# Patient Record
Sex: Male | Born: 1945 | Race: White | Hispanic: No | Marital: Married | Smoking: Never smoker
Health system: Southern US, Community
[De-identification: ages and names within clinical notes are randomized; demographics above are authoritative.]

## PROBLEM LIST (undated history)

## (undated) DIAGNOSIS — N289 Disorder of kidney and ureter, unspecified: Secondary | ICD-10-CM

## (undated) HISTORY — PX: APPENDECTOMY: SHX54

---

## 2001-08-15 ENCOUNTER — Encounter: Admission: RE | Admit: 2001-08-15 | Discharge: 2001-08-15 | Payer: Self-pay | Admitting: Family Medicine

## 2001-08-15 ENCOUNTER — Encounter: Payer: Self-pay | Admitting: Family Medicine

## 2009-06-07 ENCOUNTER — Encounter: Admission: RE | Admit: 2009-06-07 | Discharge: 2009-06-07 | Payer: Self-pay | Admitting: Otolaryngology

## 2009-07-08 ENCOUNTER — Encounter: Payer: Self-pay | Admitting: Otolaryngology

## 2009-08-05 ENCOUNTER — Ambulatory Visit (HOSPITAL_COMMUNITY): Admission: RE | Admit: 2009-08-05 | Discharge: 2009-08-05 | Payer: Self-pay | Admitting: Interventional Radiology

## 2009-08-16 ENCOUNTER — Inpatient Hospital Stay (HOSPITAL_COMMUNITY): Admission: RE | Admit: 2009-08-16 | Discharge: 2009-08-18 | Payer: Self-pay | Admitting: Interventional Radiology

## 2009-08-30 ENCOUNTER — Encounter: Payer: Self-pay | Admitting: Interventional Radiology

## 2010-01-12 ENCOUNTER — Ambulatory Visit (HOSPITAL_COMMUNITY): Admission: RE | Admit: 2010-01-12 | Discharge: 2010-01-13 | Payer: Self-pay | Admitting: Interventional Radiology

## 2010-01-26 ENCOUNTER — Encounter: Payer: Self-pay | Admitting: Interventional Radiology

## 2010-12-17 ENCOUNTER — Encounter: Payer: Self-pay | Admitting: Interventional Radiology

## 2011-02-15 LAB — CBC
HCT: 43.1 % (ref 39.0–52.0)
Hemoglobin: 15.1 g/dL (ref 13.0–17.0)
MCHC: 35 g/dL (ref 30.0–36.0)
MCHC: 35.2 g/dL (ref 30.0–36.0)
MCV: 88.9 fL (ref 78.0–100.0)
MCV: 90 fL (ref 78.0–100.0)
Platelets: 159 10*3/uL (ref 150–400)
Platelets: 205 10*3/uL (ref 150–400)
RBC: 4.01 MIL/uL — ABNORMAL LOW (ref 4.22–5.81)
RBC: 4.84 MIL/uL (ref 4.22–5.81)
RDW: 12.9 % (ref 11.5–15.5)
RDW: 13.1 % (ref 11.5–15.5)

## 2011-02-15 LAB — DIFFERENTIAL
Lymphocytes Relative: 23 % (ref 12–46)
Monocytes Relative: 8 % (ref 3–12)
Neutro Abs: 5 10*3/uL (ref 1.7–7.7)
Neutrophils Relative %: 66 % (ref 43–77)

## 2011-02-15 LAB — BASIC METABOLIC PANEL
BUN: 14 mg/dL (ref 6–23)
CO2: 29 mEq/L (ref 19–32)
Calcium: 8.4 mg/dL (ref 8.4–10.5)
Calcium: 9.3 mg/dL (ref 8.4–10.5)
Chloride: 104 mEq/L (ref 96–112)
GFR calc Af Amer: >60 mL/min (ref >60)
GFR calc non Af Amer: >60 mL/min (ref >60)
Glucose, Bld: 109 mg/dL — ABNORMAL HIGH (ref 70–99)
Sodium: 138 mEq/L (ref 135–145)

## 2011-03-03 LAB — PROTIME-INR
INR: 0.9 (ref 0.00–1.49)
Prothrombin Time: 12.9 seconds (ref 11.6–15.2)

## 2011-03-03 LAB — BASIC METABOLIC PANEL
BUN: 19 mg/dL (ref 6–23)
BUN: 9 mg/dL (ref 6–23)
CO2: 27 mEq/L (ref 19–32)
CO2: 28 mEq/L (ref 19–32)
Calcium: 8.2 mg/dL — ABNORMAL LOW (ref 8.4–10.5)
Calcium: 9.1 mg/dL (ref 8.4–10.5)
Calcium: 9.3 mg/dL (ref 8.4–10.5)
Chloride: 104 mEq/L (ref 96–112)
Chloride: 113 mEq/L — ABNORMAL HIGH (ref 96–112)
Creatinine, Ser: 0.87 mg/dL (ref 0.4–1.5)
Creatinine, Ser: 0.89 mg/dL (ref 0.4–1.5)
GFR calc Af Amer: >60 mL/min (ref >60)
GFR calc Af Amer: >60 mL/min (ref >60)
GFR calc Af Amer: >60 mL/min (ref >60)
GFR calc non Af Amer: >60 mL/min (ref >60)
GFR calc non Af Amer: >60 mL/min (ref >60)
Glucose, Bld: 100 mg/dL — ABNORMAL HIGH (ref 70–99)
Glucose, Bld: 115 mg/dL — ABNORMAL HIGH (ref 70–99)
Potassium: 4 mEq/L (ref 3.5–5.1)
Sodium: 142 mEq/L (ref 135–145)

## 2011-03-03 LAB — CBC
Hemoglobin: 14.4 g/dL (ref 13.0–17.0)
Hemoglobin: 14.4 g/dL (ref 13.0–17.0)
MCHC: 34.9 g/dL (ref 30.0–36.0)
MCHC: 34.9 g/dL (ref 30.0–36.0)
MCV: 89.5 fL (ref 78.0–100.0)
MCV: 90.2 fL (ref 78.0–100.0)
Platelets: 162 10*3/uL (ref 150–400)
RBC: 4.54 MIL/uL (ref 4.22–5.81)
RBC: 4.56 MIL/uL (ref 4.22–5.81)
RDW: 13.2 % (ref 11.5–15.5)
RDW: 13.4 % (ref 11.5–15.5)
RDW: 12.7 % (ref 11.5–15.5)

## 2011-03-03 LAB — DIFFERENTIAL
Basophils Absolute: 0 10*3/uL (ref 0.0–0.1)
Basophils Relative: 1 % (ref 0–1)
Eosinophils Absolute: 0.3 10*3/uL (ref 0.0–0.7)
Eosinophils Relative: 4 % (ref 0–5)
Lymphocytes Relative: 30 % (ref 12–46)
Lymphocytes Relative: 26 % (ref 12–46)
Monocytes Absolute: 0.6 10*3/uL (ref 0.1–1.0)
Monocytes Absolute: 0.6 10*3/uL (ref 0.1–1.0)
Monocytes Relative: 9 % (ref 3–12)
Neutro Abs: 3.5 10*3/uL (ref 1.7–7.7)
Neutro Abs: 4.4 10*3/uL (ref 1.7–7.7)
Neutrophils Relative %: 55 % (ref 43–77)
Neutrophils Relative %: 61 % (ref 43–77)

## 2011-03-03 LAB — APTT: aPTT: 28 seconds (ref 24–37)

## 2011-04-11 NOTE — Consult Note (Signed)
NAME:  Gerald Sherman, Gerald Sherman                ACCOUNT NO.:  [id number]   MEDICAL RECORD NO.:  [id number]          PATIENT TYPE:  OUT   LOCATION:  [facility]                         FACILITY:  [facility]   PHYSICIAN:  Sanjeev K. Deveshwar, M.D.DATE OF BIRTH:  1946-03-03   DATE OF CONSULTATION:  07/08/2009  DATE OF DISCHARGE:  07/08/2009                                 CONSULTATION   CHIEF COMPLAINT:  Left-sided pulsatile tinnitus for approximately 2  years.   BRIEF HISTORY:  This is a very pleasant 65 year old male who was  referred to Dr. Corliss Skains through the courtesy of Dr. Suzanna Obey for  further evaluation of pulsatile tinnitus.  The patient has been having  this problem for approximately 2 years.  He has not had any significant  associated symptoms.  He denies headache.  He denies visual changes,  although the volume of the tinnitus appears to be increasing.  The  patient denies any recent trauma, although he does remember hitting the  top of his head on an overhead board on at least 2 occasions in his  workshop.  He subsequently removed that particular board.  The patient  had an MRA performed on June 07, 2009, as ordered by Dr. Jearld Fenton.  This  showed a left dural AV fistula.  The patient has been referred to Dr.  Corliss Skains for further evaluation and treatment options.  The patient  presents today accompanied by his wife.   PAST MEDICAL HISTORY:  The patient remembers having some slag from the  welding torch going to his left ear approximately 3 years ago, however,  this apparently was evaluated and no foreign object or significant  injury was found.  The patient had an episode of severe hypertension  approximately 3 years ago that was associated with a severe headache.  It is difficult to say which came first the severe headache or the  severe hypertension.  He has been normotensive since that time and has  not required antihypertensive medications.  He denies diabetes mellitus.  He denies  hyperlipidemia.  He denies coronary artery disease and has for  the most part been very healthy.   SURGICAL HISTORY:  The patient is status post appendectomy.  He denies  any previous problems with anesthesia, although he did become somewhat  agitated after his surgery when he found he was strapped to his gurney.   ALLERGIES:  No known drug allergies.   CURRENT MEDICATIONS:  Aspirin 81 mg daily and multivitamins.   SOCIAL HISTORY:  The patient is married.  The patient's wife has 4  children from her previous marriage.  The patient and his wife live in  [place].  The patient smoked from age 35 to age 32.  He has not used  tobacco since that time.  He has never been diagnosed as having any  significant lung disease.  He does not use alcohol.  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  He tends to work very long hours.   FAMILY HISTORY:  His mother died at age 40 from natural causes.  His  father died at age 80, the cause was unknown.   IMPRESSION AND PLAN:  The patient and his wife present today to discuss  a recently diagnosed left dural AV fistula as documented by an MRA  performed on June 07, 2009.  Unfortunately, they had very little  understanding of this diagnosis.  The defect was discussed and explained  in detail along with the risks of such an abnormality.  Dr. Corliss Skains  reviewed the patient's MRA and shared the images with the patient and  his wife.  He pointed out the abnormal vasculature and explained the  significance.  Dr. Corliss Skains was concerned that if it left untreated  this would eventually lead to a hemorrhagic stroke.   A cerebral angiogram has been recommended for further evaluation of the  vessels.  The angiogram was described in detail along with the risks and  benefits of the procedure.  We also discussed embolization of the AV  fistula.  This procedure was described in detail as well along with the  risks and benefits.  The cerebral angiogram  itself could be performed  under conscious sedation.  The embolization procedure would require  general anesthesia.  The patient and his wife would like to have both  procedures performed on the same day and are anxious to proceed in order  to stabilize this situation.   We have tentatively scheduled the patient for treatment on August 05, 2009.  The patient was told that he could continue with his normal  activities until last time.  Following the embolization, he would need  to be out of work for at least 2 weeks.  Dr. Corliss Skains recommended that  he continue his aspirin for now.  The [employer] representative has been  contacted regarding the procedure.  Greater than 1 hour was spent on  this evaluation.  All of the patient's and his wife's questions were  answered.  They leave today with a good understanding of the plan.  There were told to call if they had any questions whatsoever.      Delton See, P.A.    ______________________________  Grandville Silos. Corliss Skains, M.D.    DR/MEDQ  D:  07/08/2009  T:  07/09/2009  Job:  [id number]   cc:   Suzanna Obey, M.D.  Dr. Sherryll Burger

## 2015-02-23 DIAGNOSIS — J029 Acute pharyngitis, unspecified: Secondary | ICD-10-CM | POA: Diagnosis not present

## 2015-02-23 DIAGNOSIS — R05 Cough: Secondary | ICD-10-CM | POA: Diagnosis not present

## 2015-02-23 DIAGNOSIS — J209 Acute bronchitis, unspecified: Secondary | ICD-10-CM | POA: Diagnosis not present

## 2015-02-23 DIAGNOSIS — J019 Acute sinusitis, unspecified: Secondary | ICD-10-CM | POA: Diagnosis not present

## 2015-07-07 DIAGNOSIS — T1512XA Foreign body in conjunctival sac, left eye, initial encounter: Secondary | ICD-10-CM | POA: Diagnosis not present

## 2015-08-23 DIAGNOSIS — Z23 Encounter for immunization: Secondary | ICD-10-CM | POA: Diagnosis not present

## 2016-11-02 DIAGNOSIS — Z23 Encounter for immunization: Secondary | ICD-10-CM | POA: Diagnosis not present

## 2017-05-09 DIAGNOSIS — E785 Hyperlipidemia, unspecified: Secondary | ICD-10-CM | POA: Diagnosis not present

## 2017-05-09 DIAGNOSIS — R739 Hyperglycemia, unspecified: Secondary | ICD-10-CM | POA: Diagnosis not present

## 2017-05-09 DIAGNOSIS — Z0000 Encounter for general adult medical examination without abnormal findings: Secondary | ICD-10-CM | POA: Diagnosis not present

## 2017-05-09 DIAGNOSIS — Z1389 Encounter for screening for other disorder: Secondary | ICD-10-CM | POA: Diagnosis not present

## 2017-05-09 DIAGNOSIS — Z125 Encounter for screening for malignant neoplasm of prostate: Secondary | ICD-10-CM | POA: Diagnosis not present

## 2017-05-09 DIAGNOSIS — Z6828 Body mass index (BMI) 28.0-28.9, adult: Secondary | ICD-10-CM | POA: Diagnosis not present

## 2017-05-09 DIAGNOSIS — N529 Male erectile dysfunction, unspecified: Secondary | ICD-10-CM | POA: Diagnosis not present

## 2017-05-09 DIAGNOSIS — M549 Dorsalgia, unspecified: Secondary | ICD-10-CM | POA: Diagnosis not present

## 2017-05-09 DIAGNOSIS — R200 Anesthesia of skin: Secondary | ICD-10-CM | POA: Diagnosis not present

## 2017-05-09 DIAGNOSIS — E782 Mixed hyperlipidemia: Secondary | ICD-10-CM | POA: Diagnosis not present

## 2017-05-09 DIAGNOSIS — G8324 Monoplegia of upper limb affecting left nondominant side: Secondary | ICD-10-CM | POA: Diagnosis not present

## 2017-05-09 DIAGNOSIS — Z79899 Other long term (current) drug therapy: Secondary | ICD-10-CM | POA: Diagnosis not present

## 2018-05-13 DIAGNOSIS — N529 Male erectile dysfunction, unspecified: Secondary | ICD-10-CM | POA: Diagnosis not present

## 2018-05-13 DIAGNOSIS — Z0000 Encounter for general adult medical examination without abnormal findings: Secondary | ICD-10-CM | POA: Diagnosis not present

## 2018-05-13 DIAGNOSIS — Z1389 Encounter for screening for other disorder: Secondary | ICD-10-CM | POA: Diagnosis not present

## 2018-05-13 DIAGNOSIS — M545 Low back pain: Secondary | ICD-10-CM | POA: Diagnosis not present

## 2018-05-13 DIAGNOSIS — E663 Overweight: Secondary | ICD-10-CM | POA: Diagnosis not present

## 2018-05-13 DIAGNOSIS — Z125 Encounter for screening for malignant neoplasm of prostate: Secondary | ICD-10-CM | POA: Diagnosis not present

## 2018-05-13 DIAGNOSIS — Z6828 Body mass index (BMI) 28.0-28.9, adult: Secondary | ICD-10-CM | POA: Diagnosis not present

## 2018-05-13 DIAGNOSIS — E7800 Pure hypercholesterolemia, unspecified: Secondary | ICD-10-CM | POA: Diagnosis not present

## 2018-10-29 DIAGNOSIS — Z23 Encounter for immunization: Secondary | ICD-10-CM | POA: Diagnosis not present

## 2018-11-29 ENCOUNTER — Other Ambulatory Visit: Payer: Self-pay

## 2018-11-29 ENCOUNTER — Emergency Department (HOSPITAL_COMMUNITY)
Admission: EM | Admit: 2018-11-29 | Discharge: 2018-11-29 | Disposition: A | Discharge status: Home / Self Care | Payer: Medicare Other | Attending: Emergency Medicine | Admitting: Emergency Medicine

## 2018-11-29 ENCOUNTER — Encounter (HOSPITAL_COMMUNITY): Payer: Self-pay

## 2018-11-29 ENCOUNTER — Emergency Department (HOSPITAL_COMMUNITY): Payer: Medicare Other

## 2018-11-29 DIAGNOSIS — N200 Calculus of kidney: Secondary | ICD-10-CM | POA: Insufficient documentation

## 2018-11-29 DIAGNOSIS — N132 Hydronephrosis with renal and ureteral calculous obstruction: Secondary | ICD-10-CM | POA: Diagnosis not present

## 2018-11-29 DIAGNOSIS — R1084 Generalized abdominal pain: Secondary | ICD-10-CM | POA: Diagnosis present

## 2018-11-29 HISTORY — DX: Disorder of kidney and ureter, unspecified: N28.9

## 2018-11-29 LAB — URINALYSIS, ROUTINE W REFLEX MICROSCOPIC
BILIRUBIN URINE: NEGATIVE
Glucose, UA: NEGATIVE mg/dL
KETONES UR: NEGATIVE mg/dL
NITRITE: NEGATIVE
PH: 7 (ref 5.0–8.0)
Protein, ur: NEGATIVE mg/dL
RBC / HPF: >50 RBC/hpf — ABNORMAL HIGH (ref 0–5)
SPECIFIC GRAVITY, URINE: 1.016 (ref 1.005–1.030)

## 2018-11-29 LAB — BASIC METABOLIC PANEL
Anion gap: 9 (ref 5–15)
BUN: 24 mg/dL — ABNORMAL HIGH (ref 8–23)
CALCIUM: 9.3 mg/dL (ref 8.9–10.3)
CHLORIDE: 104 mmol/L (ref 98–111)
CO2: 25 mmol/L (ref 22–32)
CREATININE: 1.74 mg/dL — AB (ref 0.61–1.24)
GFR calc Af Amer: 45 mL/min — ABNORMAL LOW (ref >60)
GFR calc non Af Amer: 39 mL/min — ABNORMAL LOW (ref >60)
GLUCOSE: 108 mg/dL — AB (ref 70–99)
Potassium: 3.9 mmol/L (ref 3.5–5.1)
Sodium: 138 mmol/L (ref 135–145)

## 2018-11-29 LAB — CBC WITH DIFFERENTIAL/PLATELET
Abs Immature Granulocytes: 0.03 10*3/uL (ref 0.00–0.07)
BASOS ABS: 0 10*3/uL (ref 0.0–0.1)
Basophils Relative: 0 %
EOS ABS: 0.2 10*3/uL (ref 0.0–0.5)
EOS PCT: 2 %
HEMATOCRIT: 42.4 % (ref 39.0–52.0)
HEMOGLOBIN: 14.2 g/dL (ref 13.0–17.0)
IMMATURE GRANULOCYTES: 0 %
LYMPHS ABS: 1.1 10*3/uL (ref 0.7–4.0)
LYMPHS PCT: 11 %
MCH: 29.2 pg (ref 26.0–34.0)
MCHC: 33.5 g/dL (ref 30.0–36.0)
MCV: 87.2 fL (ref 80.0–100.0)
MONOS PCT: 13 %
Monocytes Absolute: 1.4 10*3/uL — ABNORMAL HIGH (ref 0.1–1.0)
NRBC: 0 % (ref 0.0–0.2)
Neutro Abs: 7.5 10*3/uL (ref 1.7–7.7)
Neutrophils Relative %: 74 %
Platelets: 183 10*3/uL (ref 150–400)
RBC: 4.86 MIL/uL (ref 4.22–5.81)
RDW: 13 % (ref 11.5–15.5)
WBC: 10.2 10*3/uL (ref 4.0–10.5)

## 2018-11-29 MED ORDER — HYDROMORPHONE HCL 1 MG/ML IJ SOLN
1.0000 mg | Freq: Once | INTRAMUSCULAR | Status: AC
  Administered 2018-11-29: 1 mg via INTRAVENOUS
  Filled 2018-11-29: qty 1

## 2018-11-29 MED ORDER — HYDROCODONE-ACETAMINOPHEN 5-325 MG PO TABS: 1.0000 | ORAL_TABLET | Freq: Four times a day (QID) | ORAL | 0 refills | Status: AC | PRN

## 2018-11-29 MED ORDER — ONDANSETRON HCL 4 MG/2ML IJ SOLN
4.0000 mg | Freq: Once | INTRAMUSCULAR | Status: AC
  Administered 2018-11-29: 4 mg via INTRAVENOUS
  Filled 2018-11-29: qty 2

## 2018-11-29 MED ORDER — SODIUM CHLORIDE 0.9 % IV BOLUS
500.0000 mL | Freq: Once | INTRAVENOUS | Status: AC
  Administered 2018-11-29: 500 mL via INTRAVENOUS

## 2018-11-29 NOTE — ED Provider Notes (Signed)
[facility] EMERGENCY DEPARTMENT Provider Note   CSN: [id number] Arrival date & time: 11/29/18  [id number]     History   Chief Complaint Chief Complaint  Patient presents with  . Flank Pain    HPI Gerald Sherman is a 73 y.o. male.  HPI Patient presents with left flank pain.  Has had for most of this week.  History of kidney stones the last 1 passed without having to go to the ER.  Last CAT scan was probably around 10 years ago.  Pain is severe in the left flank.  Has a history of back pain but states this feels different.  Has had some difficulty urinating.  Has had some chills at times.  Has had some nausea.  No frank fevers however.  Pain is sharp.  Patient took 2 Norco's that he had at home to help with the pain.  States that he normally only has to take half a pill. Past Medical History:  Diagnosis Date  . Renal disorder    kidney stones    There are no active problems to display for this patient.   Past Surgical History:  Procedure Laterality Date  . APPENDECTOMY          Home Medications    Prior to Admission medications   Medication Sig Start Date End Date Taking? Authorizing Provider  HYDROcodone-acetaminophen (NORCO/VICODIN) 5-325 MG tablet Take 1-2 tablets by mouth every 6 (six) hours as needed. 11/29/18   Benjiman CorePickering, Iver Miklas, MD    Family History No family history on file.  Social History Social History   Tobacco Use  . Smoking status: Never Smoker  . Smokeless tobacco: Never Used  Substance Use Topics  . Alcohol use: Never    Frequency: Never  . Drug use: Never     Allergies   Patient has no known allergies.   Review of Systems Review of Systems  Constitutional: Positive for chills. Negative for appetite change.  HENT: Negative for congestion.   Respiratory: Negative for shortness of breath.   Cardiovascular: Negative for chest pain.  Gastrointestinal: Positive for nausea. Negative for abdominal pain.  Genitourinary: Positive for difficulty  urinating and flank pain.  Musculoskeletal: Positive for back pain.  Skin: Negative for rash.  Neurological: Negative for weakness.  Psychiatric/Behavioral: Negative for confusion.     Physical Exam Updated Vital Signs BP 127/75 (BP Location: Right Arm)   Pulse 78   Temp 98.1 F (36.7 C) (Oral)   Resp 18   Ht 6\' 2"  (1.88 m)   Wt 90.7 kg   SpO2 97%   BMI 25.68 kg/m   Physical Exam Constitutional:      Comments: Patient appears uncomfortable  HENT:     Head: Atraumatic.  Eyes:     Conjunctiva/sclera: Conjunctivae normal.  Neck:     Musculoskeletal: Neck supple.  Cardiovascular:     Rate and Rhythm: Normal rate and regular rhythm.  Pulmonary:     Breath sounds: No wheezing or rhonchi.  Chest:     Chest wall: No tenderness.  Abdominal:     General: There is distension.  Genitourinary:    Comments: CVA tenderness on left. Musculoskeletal:        General: No deformity.  Skin:    General: Skin is warm.     Capillary Refill: Capillary refill takes less than 2 seconds.  Neurological:     General: No focal deficit present.     Mental Status: He is alert.  Psychiatric:  Mood and Affect: Mood normal.      ED Treatments / Results  Labs (all labs ordered are listed, but only abnormal results are displayed) Labs Reviewed  BASIC METABOLIC PANEL - Abnormal; Notable for the following components:      Result Value   Glucose, Bld 108 (*)    BUN 24 (*)    Creatinine, Ser 1.74 (*)    GFR calc non Af Amer 39 (*)    GFR calc Af Amer 45 (*)    All other components within normal limits  CBC WITH DIFFERENTIAL/PLATELET - Abnormal; Notable for the following components:   Monocytes Absolute 1.4 (*)    All other components within normal limits  URINALYSIS, ROUTINE W REFLEX MICROSCOPIC - Abnormal; Notable for the following components:   APPearance HAZY (*)    Hgb urine dipstick LARGE (*)    Leukocytes, UA TRACE (*)    RBC / HPF >50 (*)    Bacteria, UA RARE (*)    All  other components within normal limits    EKG None  Radiology Ct Renal Stone Study  Result Date: 11/29/2018 CLINICAL DATA:  Left flank pain since Wednesday. More severe this morning. EXAM: CT ABDOMEN AND PELVIS WITHOUT CONTRAST TECHNIQUE: Multidetector CT imaging of the abdomen and pelvis was performed following the standard protocol without IV contrast. COMPARISON:  None available. FINDINGS: Lower chest: Bibasilar scarring or atelectasis but no infiltrates or effusions. No worrisome pulmonary lesions. The heart is normal in size. No pericardial effusion. The distal esophagus is grossly normal. Hepatobiliary: Small calcified granuloma but no worrisome hepatic lesions or intrahepatic biliary dilatation. The gallbladder appears normal. No common bile duct dilatation. Pancreas: No mass, inflammation or ductal dilatation. Spleen: Normal size.  No focal lesions. Adrenals/Urinary Tract: The adrenal glands are normal. Right kidney: 3.5 mm midpole right renal calculus but no obstructing ureteral calculi or hydronephrosis. No worrisome renal lesions without contrast. Left kidney: Moderate to marked left-sided hydroureteronephrosis down to an obstructing 4 mm calculus at the left UVJ. No left-sided renal calculi. Simple midpole cyst is noted. No worrisome left renal lesions. The bladder is unremarkable. No worrisome bladder lesions. The right side of the bladder is pointing towards a right inguinal hernia. Stomach/Bowel: The stomach, duodenum, small bowel and colon are grossly normal without oral contrast. No inflammatory changes, mass lesions or obstructive findings. The terminal ileum and appendix are normal. Advanced descending and sigmoid colon diverticulosis but no findings for acute diverticulitis. The terminal ileum is normal. The appendix is surgically absent. Vascular/Lymphatic: The aorta is normal in caliber. Scattered atheroscerlotic calcifications. No mesenteric of retroperitoneal mass or adenopathy. Small  scattered lymph nodes are noted. Large calcification noted near the posterior wall of the stomach. Reproductive: The prostate gland and seminal vesicles are unremarkable. Other: No pelvic mass or adenopathy. No free pelvic fluid collections. No inguinal mass or adenopathy. There are bilateral inguinal hernias containing fat, right larger than left. No abdominal wall hernia or subcutaneous lesions. Musculoskeletal: No significant bony findings. Moderate degenerative changes involving the spine. IMPRESSION: 1. 4 mm left UVJ calculus causing moderate to marked obstructive findings with hydroureteronephrosis. 2. Right renal calculus but no obstructing right ureteral calculus. 3. No worrisome renal or bladder lesions without contrast. 4. No other acute abdominal/pelvic findings and no mass lesions or lymphadenopathy. 5. Right inguinal hernia containing fat. The lower right aspect of the bladder is starting to enter the hernia. Electronically Signed   By: Rudie Meyer M.D.   On: 11/29/2018 09:10  Procedures Procedures (including critical care time)  Medications Ordered in ED Medications  HYDROmorphone (DILAUDID) injection 1 mg (1 mg Intravenous Given 11/29/18 0805)  ondansetron (ZOFRAN) injection 4 mg (4 mg Intravenous Given 11/29/18 0805)  sodium chloride 0.9 % bolus 500 mL (0 mLs Intravenous Stopped 11/29/18 1102)     Initial Impression / Assessment and Plan / ED Course  I have reviewed the triage vital signs and the nursing notes.  Pertinent labs & imaging results that were available during my care of the patient were reviewed by me and considered in my medical decision making (see chart for details).     Patient with flank pain.  Has previous ureteral stone and shows stone on the left side.  4 mm.  Pain improved.  Discharge home with urology follow-up.  Final Clinical Impressions(s) / ED Diagnoses   Final diagnoses:  Kidney stone    ED Discharge Orders         Ordered     HYDROcodone-acetaminophen (NORCO/VICODIN) 5-325 MG tablet  Every 6 hours PRN     11/29/18 0934           Benjiman Core, MD 11/29/18 1458

## 2018-11-29 NOTE — ED Triage Notes (Signed)
Pt c/o pain to r flank since Wednesday.  Severe pain since this morning.  Reports history of kidney stones.  Pt took to aleve PTA.

## 2019-05-14 DIAGNOSIS — Z0000 Encounter for general adult medical examination without abnormal findings: Secondary | ICD-10-CM | POA: Diagnosis not present

## 2019-05-14 DIAGNOSIS — E663 Overweight: Secondary | ICD-10-CM | POA: Diagnosis not present

## 2019-05-14 DIAGNOSIS — E782 Mixed hyperlipidemia: Secondary | ICD-10-CM | POA: Diagnosis not present

## 2019-05-14 DIAGNOSIS — Z1389 Encounter for screening for other disorder: Secondary | ICD-10-CM | POA: Diagnosis not present

## 2019-05-14 DIAGNOSIS — Z6828 Body mass index (BMI) 28.0-28.9, adult: Secondary | ICD-10-CM | POA: Diagnosis not present

## 2019-10-14 DIAGNOSIS — Z23 Encounter for immunization: Secondary | ICD-10-CM | POA: Diagnosis not present

## 2020-06-16 DIAGNOSIS — Z125 Encounter for screening for malignant neoplasm of prostate: Secondary | ICD-10-CM | POA: Diagnosis not present

## 2020-08-19 DIAGNOSIS — Z23 Encounter for immunization: Secondary | ICD-10-CM | POA: Diagnosis not present

## 2020-08-19 DIAGNOSIS — E663 Overweight: Secondary | ICD-10-CM | POA: Diagnosis not present

## 2020-08-19 DIAGNOSIS — Z6828 Body mass index (BMI) 28.0-28.9, adult: Secondary | ICD-10-CM | POA: Diagnosis not present

## 2020-08-19 DIAGNOSIS — M79659 Pain in unspecified thigh: Secondary | ICD-10-CM | POA: Diagnosis not present

## 2020-09-20 DIAGNOSIS — M545 Low back pain, unspecified: Secondary | ICD-10-CM | POA: Diagnosis not present

## 2020-09-20 DIAGNOSIS — Z6828 Body mass index (BMI) 28.0-28.9, adult: Secondary | ICD-10-CM | POA: Diagnosis not present

## 2020-09-20 DIAGNOSIS — G8220 Paraplegia, unspecified: Secondary | ICD-10-CM | POA: Diagnosis not present

## 2020-09-20 DIAGNOSIS — E663 Overweight: Secondary | ICD-10-CM | POA: Diagnosis not present

## 2020-11-15 DIAGNOSIS — M79604 Pain in right leg: Secondary | ICD-10-CM | POA: Diagnosis not present

## 2020-11-15 DIAGNOSIS — R269 Unspecified abnormalities of gait and mobility: Secondary | ICD-10-CM | POA: Diagnosis not present

## 2020-11-15 DIAGNOSIS — Z6825 Body mass index (BMI) 25.0-25.9, adult: Secondary | ICD-10-CM | POA: Diagnosis not present

## 2020-11-15 DIAGNOSIS — E663 Overweight: Secondary | ICD-10-CM | POA: Diagnosis not present

## 2020-11-15 DIAGNOSIS — M791 Myalgia, unspecified site: Secondary | ICD-10-CM | POA: Diagnosis not present

## 2020-11-16 ENCOUNTER — Other Ambulatory Visit (HOSPITAL_COMMUNITY): Payer: Self-pay | Admitting: Physician Assistant

## 2020-11-16 DIAGNOSIS — M791 Myalgia, unspecified site: Secondary | ICD-10-CM

## 2020-11-16 DIAGNOSIS — M79604 Pain in right leg: Secondary | ICD-10-CM

## 2020-11-16 DIAGNOSIS — M5416 Radiculopathy, lumbar region: Secondary | ICD-10-CM

## 2020-11-16 DIAGNOSIS — R269 Unspecified abnormalities of gait and mobility: Secondary | ICD-10-CM

## 2020-11-16 DIAGNOSIS — G959 Disease of spinal cord, unspecified: Secondary | ICD-10-CM

## 2020-11-17 ENCOUNTER — Other Ambulatory Visit: Payer: Self-pay | Admitting: Physician Assistant

## 2020-11-17 ENCOUNTER — Other Ambulatory Visit (HOSPITAL_COMMUNITY): Payer: Self-pay | Admitting: Physician Assistant

## 2020-11-17 DIAGNOSIS — M791 Myalgia, unspecified site: Secondary | ICD-10-CM

## 2020-11-17 DIAGNOSIS — M79604 Pain in right leg: Secondary | ICD-10-CM

## 2020-11-17 DIAGNOSIS — R2689 Other abnormalities of gait and mobility: Secondary | ICD-10-CM

## 2020-11-17 DIAGNOSIS — Q232 Congenital mitral stenosis: Secondary | ICD-10-CM

## 2020-11-17 DIAGNOSIS — R269 Unspecified abnormalities of gait and mobility: Secondary | ICD-10-CM

## 2020-11-17 DIAGNOSIS — Z6828 Body mass index (BMI) 28.0-28.9, adult: Secondary | ICD-10-CM

## 2021-01-18 DIAGNOSIS — Z6828 Body mass index (BMI) 28.0-28.9, adult: Secondary | ICD-10-CM | POA: Diagnosis not present

## 2021-01-18 DIAGNOSIS — E663 Overweight: Secondary | ICD-10-CM | POA: Diagnosis not present

## 2021-01-18 DIAGNOSIS — M79606 Pain in leg, unspecified: Secondary | ICD-10-CM | POA: Diagnosis not present

## 2021-01-18 DIAGNOSIS — J019 Acute sinusitis, unspecified: Secondary | ICD-10-CM | POA: Diagnosis not present

## 2021-06-02 DIAGNOSIS — E663 Overweight: Secondary | ICD-10-CM | POA: Diagnosis not present

## 2021-06-02 DIAGNOSIS — Z0001 Encounter for general adult medical examination with abnormal findings: Secondary | ICD-10-CM | POA: Diagnosis not present

## 2021-06-02 DIAGNOSIS — E782 Mixed hyperlipidemia: Secondary | ICD-10-CM | POA: Diagnosis not present

## 2021-06-02 DIAGNOSIS — Z0000 Encounter for general adult medical examination without abnormal findings: Secondary | ICD-10-CM | POA: Diagnosis not present

## 2021-06-02 DIAGNOSIS — G894 Chronic pain syndrome: Secondary | ICD-10-CM | POA: Diagnosis not present

## 2021-06-02 DIAGNOSIS — Z1331 Encounter for screening for depression: Secondary | ICD-10-CM | POA: Diagnosis not present

## 2021-06-02 DIAGNOSIS — Z1389 Encounter for screening for other disorder: Secondary | ICD-10-CM | POA: Diagnosis not present

## 2021-06-02 DIAGNOSIS — Z6827 Body mass index (BMI) 27.0-27.9, adult: Secondary | ICD-10-CM | POA: Diagnosis not present

## 2021-06-02 DIAGNOSIS — N529 Male erectile dysfunction, unspecified: Secondary | ICD-10-CM | POA: Diagnosis not present

## 2021-09-08 DIAGNOSIS — Z23 Encounter for immunization: Secondary | ICD-10-CM | POA: Diagnosis not present

## 2021-09-14 DIAGNOSIS — M79604 Pain in right leg: Secondary | ICD-10-CM | POA: Diagnosis not present

## 2021-09-14 DIAGNOSIS — Z6828 Body mass index (BMI) 28.0-28.9, adult: Secondary | ICD-10-CM | POA: Diagnosis not present

## 2021-09-14 DIAGNOSIS — R309 Painful micturition, unspecified: Secondary | ICD-10-CM | POA: Diagnosis not present

## 2021-09-14 DIAGNOSIS — M79605 Pain in left leg: Secondary | ICD-10-CM | POA: Diagnosis not present

## 2021-09-14 DIAGNOSIS — R252 Cramp and spasm: Secondary | ICD-10-CM | POA: Diagnosis not present

## 2021-09-14 DIAGNOSIS — G894 Chronic pain syndrome: Secondary | ICD-10-CM | POA: Diagnosis not present

## 2021-09-14 DIAGNOSIS — M545 Low back pain, unspecified: Secondary | ICD-10-CM | POA: Diagnosis not present

## 2021-09-19 ENCOUNTER — Other Ambulatory Visit: Payer: Self-pay

## 2021-09-19 ENCOUNTER — Ambulatory Visit (HOSPITAL_COMMUNITY)
Admission: RE | Admit: 2021-09-19 | Discharge: 2021-09-19 | Disposition: A | Discharge status: Home / Self Care | Payer: Medicare Other | Source: Ambulatory Visit | Attending: Physician Assistant | Admitting: Physician Assistant

## 2021-09-19 ENCOUNTER — Other Ambulatory Visit (HOSPITAL_COMMUNITY): Payer: Self-pay | Admitting: Physician Assistant

## 2021-09-19 DIAGNOSIS — M545 Low back pain, unspecified: Secondary | ICD-10-CM | POA: Diagnosis not present

## 2021-11-14 DIAGNOSIS — Z6828 Body mass index (BMI) 28.0-28.9, adult: Secondary | ICD-10-CM | POA: Diagnosis not present

## 2021-11-14 DIAGNOSIS — Z20828 Contact with and (suspected) exposure to other viral communicable diseases: Secondary | ICD-10-CM | POA: Diagnosis not present

## 2021-11-14 DIAGNOSIS — M5136 Other intervertebral disc degeneration, lumbar region: Secondary | ICD-10-CM | POA: Diagnosis not present

## 2021-11-14 DIAGNOSIS — E663 Overweight: Secondary | ICD-10-CM | POA: Diagnosis not present

## 2021-11-14 DIAGNOSIS — M79604 Pain in right leg: Secondary | ICD-10-CM | POA: Diagnosis not present

## 2021-11-14 DIAGNOSIS — M4316 Spondylolisthesis, lumbar region: Secondary | ICD-10-CM | POA: Diagnosis not present

## 2022-05-16 DIAGNOSIS — M7711 Lateral epicondylitis, right elbow: Secondary | ICD-10-CM | POA: Diagnosis not present

## 2022-05-16 DIAGNOSIS — M75102 Unspecified rotator cuff tear or rupture of left shoulder, not specified as traumatic: Secondary | ICD-10-CM | POA: Diagnosis not present

## 2022-07-11 DIAGNOSIS — Z13220 Encounter for screening for lipoid disorders: Secondary | ICD-10-CM | POA: Diagnosis not present

## 2022-07-11 DIAGNOSIS — Z0000 Encounter for general adult medical examination without abnormal findings: Secondary | ICD-10-CM | POA: Diagnosis not present

## 2022-09-20 DIAGNOSIS — G894 Chronic pain syndrome: Secondary | ICD-10-CM | POA: Diagnosis not present

## 2022-09-20 DIAGNOSIS — M19021 Primary osteoarthritis, right elbow: Secondary | ICD-10-CM | POA: Diagnosis not present

## 2022-09-20 DIAGNOSIS — Z23 Encounter for immunization: Secondary | ICD-10-CM | POA: Diagnosis not present

## 2022-12-22 IMAGING — DX DG LUMBAR SPINE 2-3V
3 series · 3 of 3 positions shown · non-contrast
Comparison: CT 11/29/2018.

CLINICAL DATA: Low back pain.

EXAM:
LUMBAR SPINE - 2-3 VIEW

[l-spine ap]
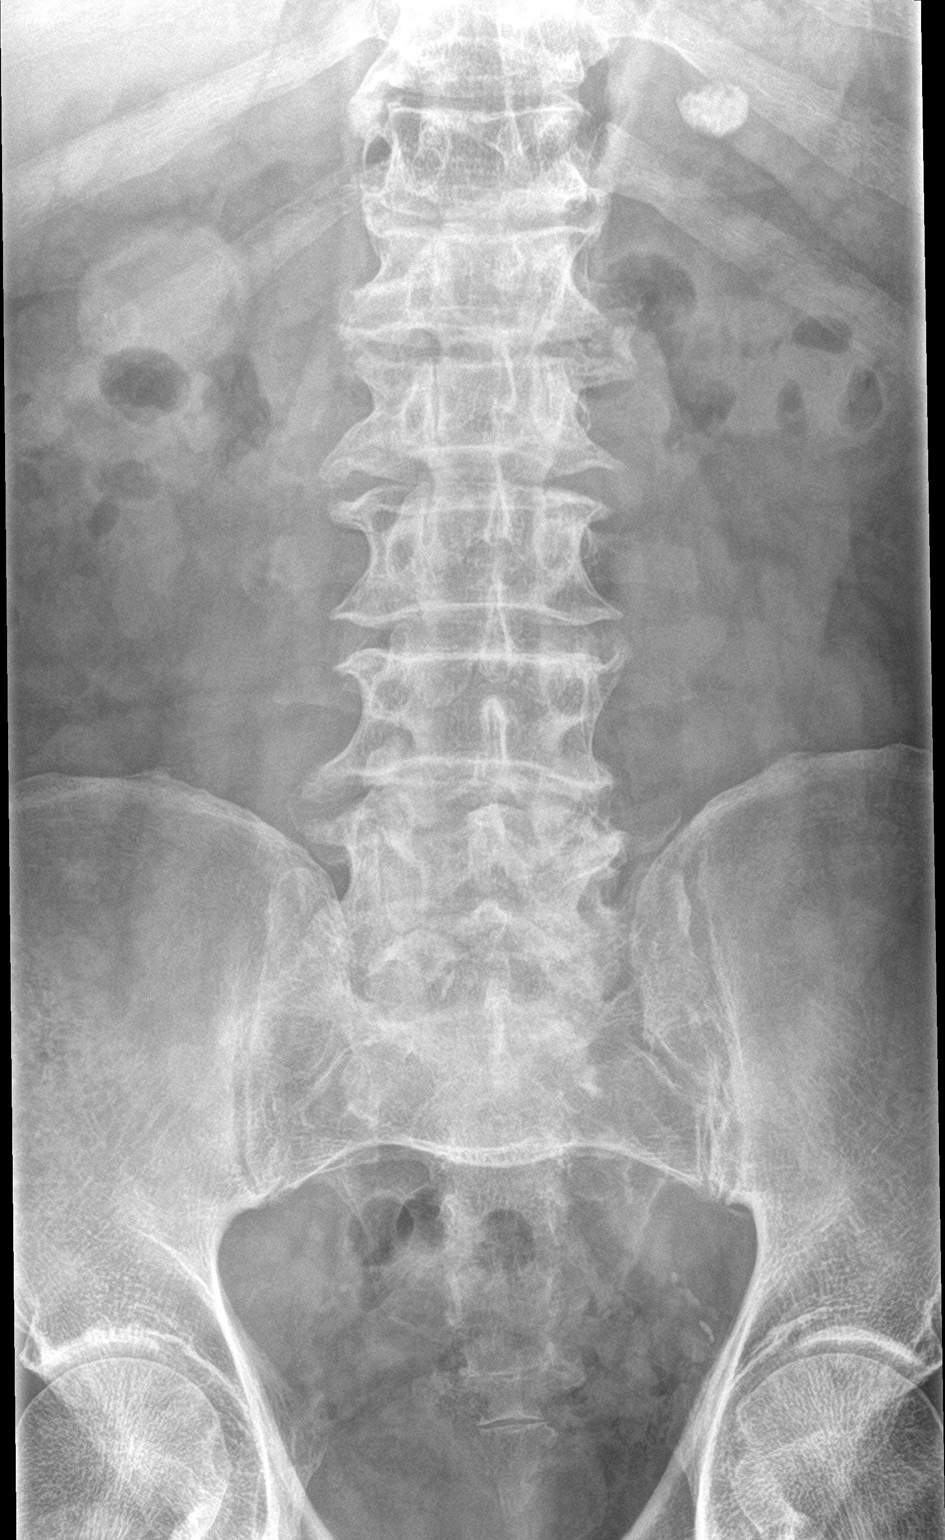

[l-spine lat]
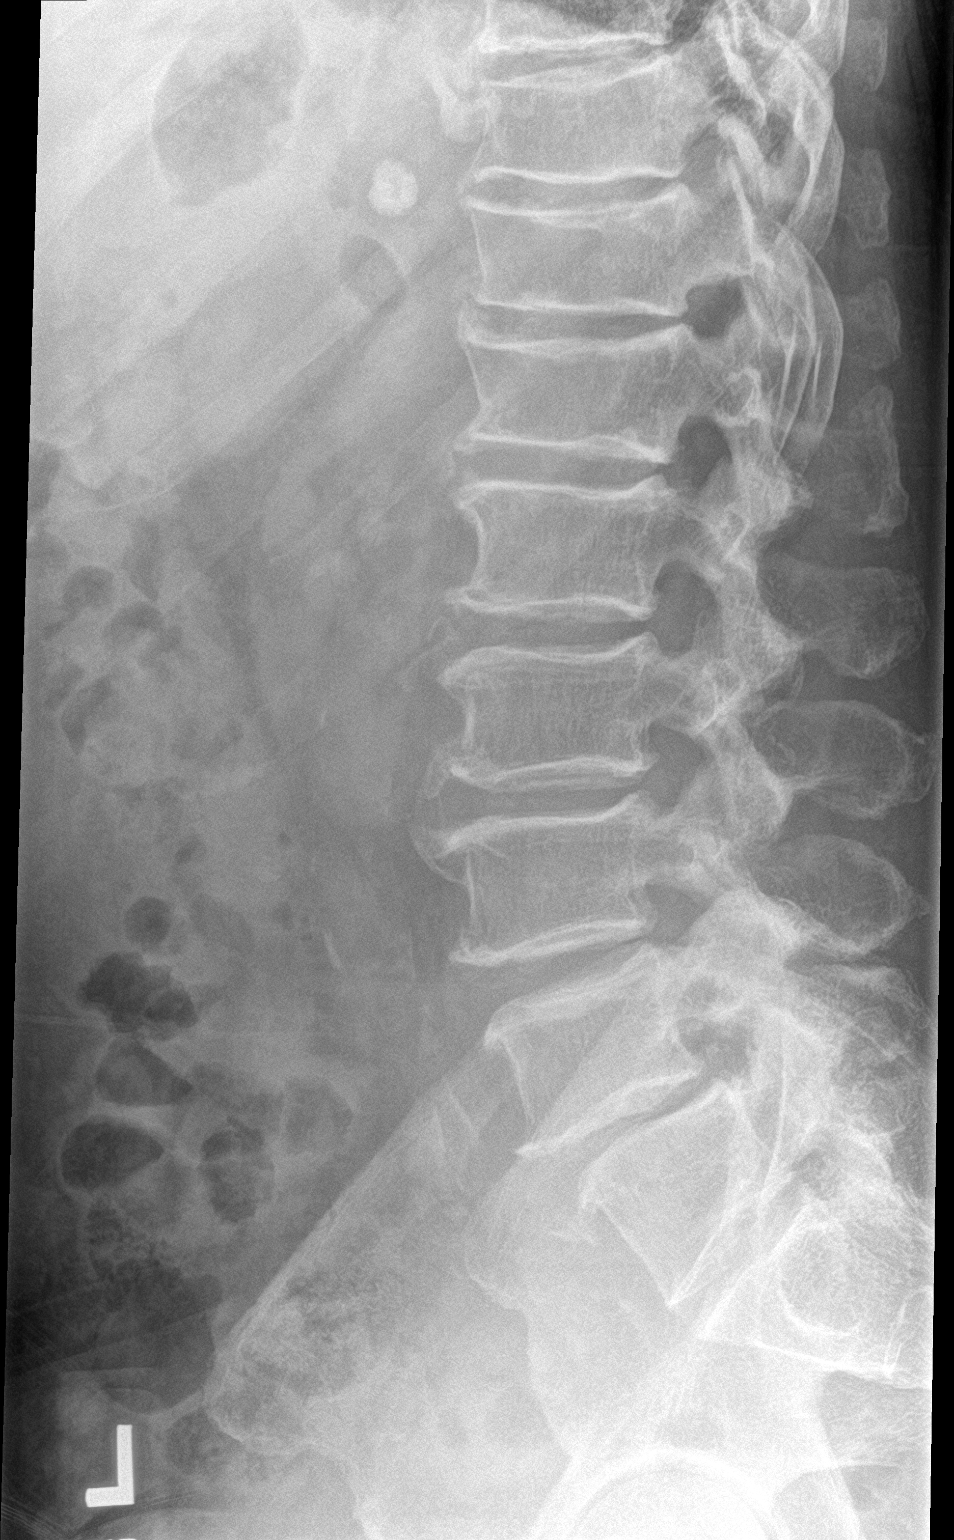

[l-spine spot]
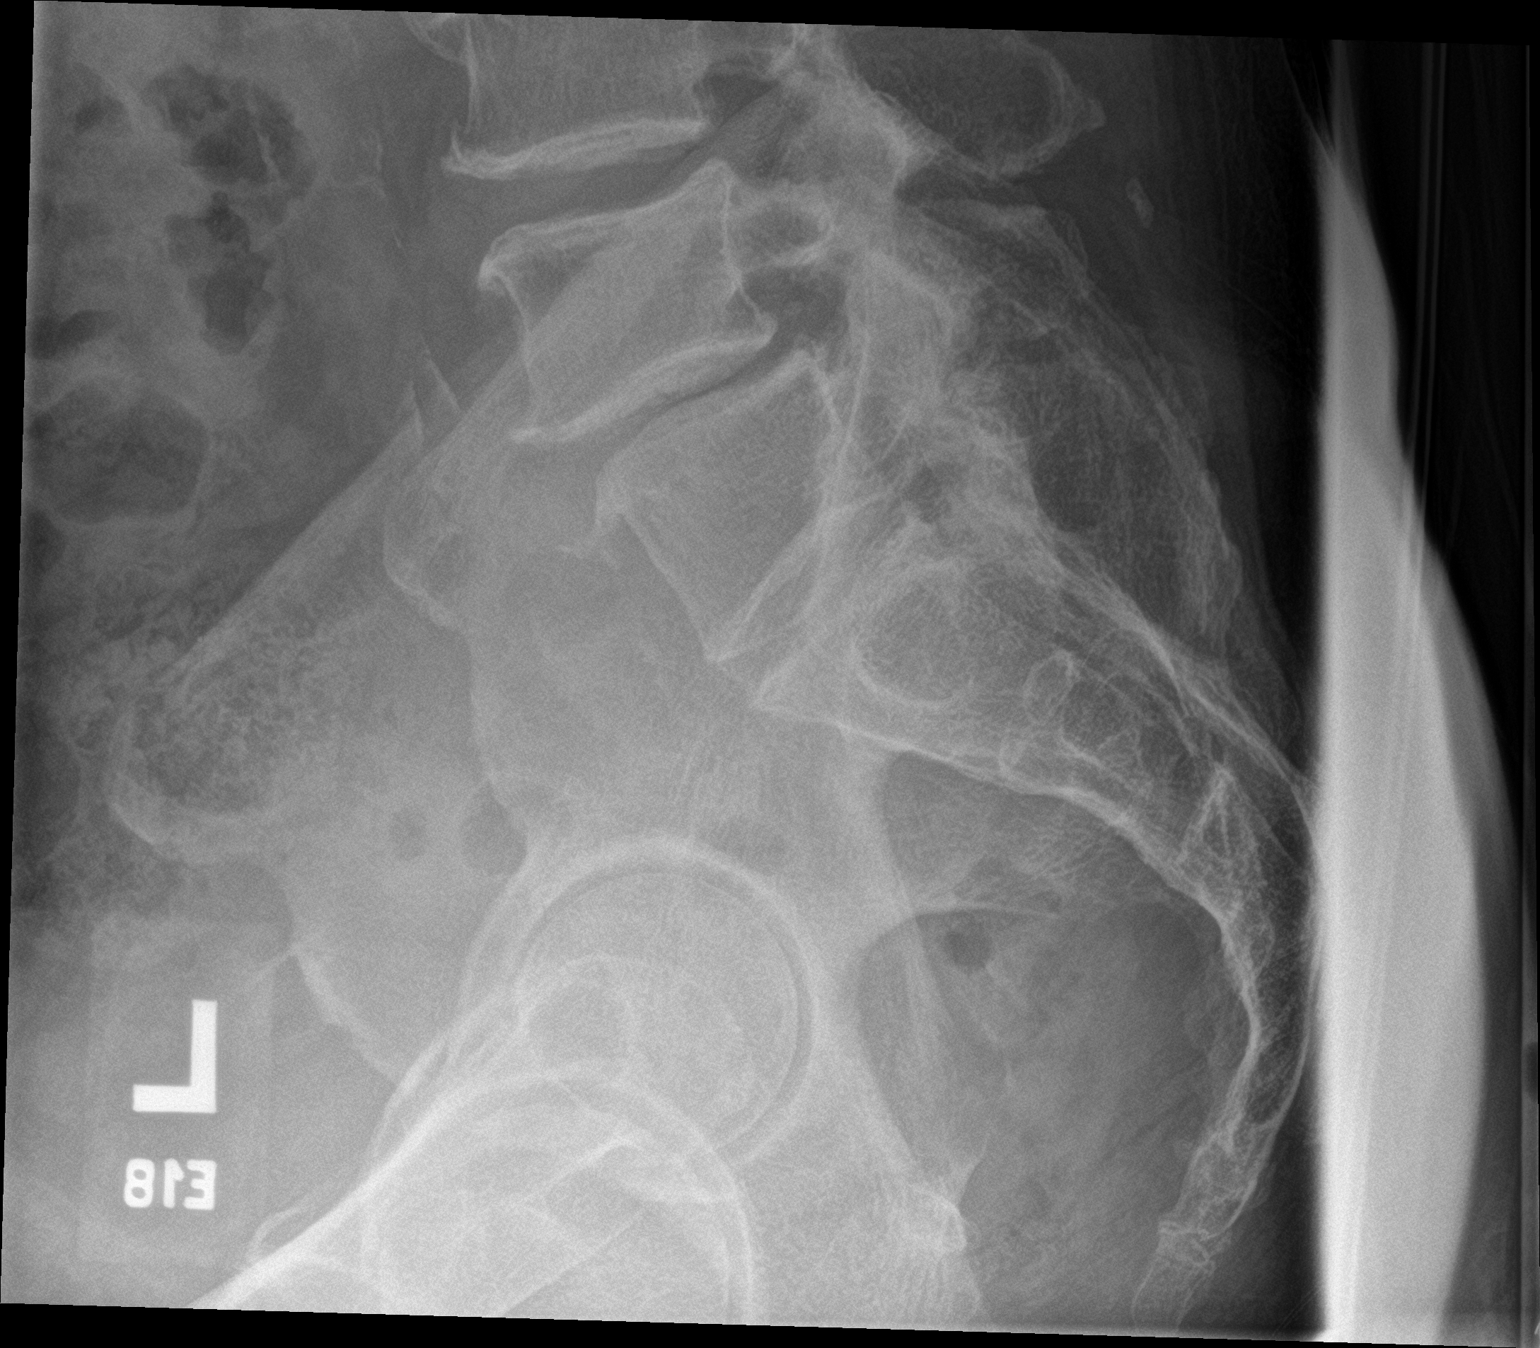

[3 of 3 positions shown; findings below may reference images not displayed]

FINDINGS: Lumbar spine numbered the lowest segmented appearing lumbar shaped
vertebrae on lateral view as L5. Diffuse multilevel disc
degeneration and endplate osteophyte formation. 4 mm anterolisthesis
L4 on L5. No acute bony abnormality identified. No evidence of
fracture. Aortoiliac atherosclerotic vascular calcification.
IMPRESSION: 1. Diffuse multilevel degenerative change with multilevel disc
degeneration and endplate osteophyte formation. 4 mm anterolisthesis
L4 on L5. No acute bony abnormality.

2.  Aortoiliac atherosclerotic vascular disease.

## 2023-02-16 ENCOUNTER — Encounter (HOSPITAL_COMMUNITY): Payer: Self-pay | Admitting: Orthopedic Surgery

## 2023-02-16 ENCOUNTER — Other Ambulatory Visit (HOSPITAL_COMMUNITY): Payer: Self-pay | Admitting: Orthopedic Surgery

## 2023-02-16 DIAGNOSIS — M79661 Pain in right lower leg: Secondary | ICD-10-CM

## 2023-02-16 DIAGNOSIS — M79604 Pain in right leg: Secondary | ICD-10-CM

## 2023-02-23 ENCOUNTER — Ambulatory Visit (HOSPITAL_COMMUNITY)
Admission: RE | Admit: 2023-02-23 | Discharge: 2023-02-23 | Disposition: A | Discharge status: Home / Self Care | Payer: Medicare Other | Source: Ambulatory Visit | Attending: Orthopedic Surgery | Admitting: Orthopedic Surgery

## 2023-02-23 DIAGNOSIS — M79604 Pain in right leg: Secondary | ICD-10-CM | POA: Diagnosis not present

## 2023-02-23 DIAGNOSIS — M79661 Pain in right lower leg: Secondary | ICD-10-CM | POA: Diagnosis not present

## 2023-03-29 DIAGNOSIS — G8929 Other chronic pain: Secondary | ICD-10-CM | POA: Diagnosis not present

## 2023-03-29 DIAGNOSIS — M549 Dorsalgia, unspecified: Secondary | ICD-10-CM | POA: Diagnosis not present

## 2023-03-29 DIAGNOSIS — M79604 Pain in right leg: Secondary | ICD-10-CM | POA: Diagnosis not present

## 2023-06-08 ENCOUNTER — Encounter: Payer: Self-pay | Admitting: Cardiovascular Disease

## 2023-06-08 ENCOUNTER — Ambulatory Visit: Payer: Medicare Other | Attending: Cardiovascular Disease | Admitting: Cardiovascular Disease

## 2023-06-08 VITALS — BP 129/82 | HR 56 | Ht 74.0 in | Wt 220.2 lb

## 2023-06-08 DIAGNOSIS — E782 Mixed hyperlipidemia: Secondary | ICD-10-CM | POA: Diagnosis not present

## 2023-06-08 DIAGNOSIS — R079 Chest pain, unspecified: Secondary | ICD-10-CM | POA: Diagnosis not present

## 2023-06-08 DIAGNOSIS — Z8673 Personal history of transient ischemic attack (TIA), and cerebral infarction without residual deficits: Secondary | ICD-10-CM

## 2023-06-08 DIAGNOSIS — N529 Male erectile dysfunction, unspecified: Secondary | ICD-10-CM | POA: Diagnosis not present

## 2023-06-08 DIAGNOSIS — R0609 Other forms of dyspnea: Secondary | ICD-10-CM | POA: Diagnosis not present

## 2023-06-08 DIAGNOSIS — R9431 Abnormal electrocardiogram [ECG] [EKG]: Secondary | ICD-10-CM

## 2023-06-08 MED ORDER — ROSUVASTATIN CALCIUM 10 MG PO TABS: 10.0000 mg | ORAL_TABLET | Freq: Every day | ORAL | 3 refills | Status: AC

## 2023-06-08 NOTE — Progress Notes (Signed)
Cardiology Office Note:  .   Date:  06/08/2023  ID:  Gerald Sherman, DOB March 22, 1946, MRN [id number] PCP: Samuella Bruin  [facility] Providers Cardiologist:  None    Gerald Sherman is a 77 y.o. male who is being seen today for the evaluation of chest pain and shortness of breath at the request of Shawnie Dapper, PA-C.   History of Present Illness: .   Gerald Sherman is a 77 y.o. male with generally excellent health who has developed complaints of reduced exertional tolerance and an episode of severe chest tightness and shortness of breath that occurred about 2 weeks ago.  For about a year now he has noticed that that physical exertion is more difficult.  He used to be able to push a wheelbarrow with chopped wood uphill 10 times in a row without stopping.  Currently he has to stop after each trip to rest for a few minutes.  He has no difficulty climbing stairs or even climbing a ladder to the top of his house and has no shortness of breath with activities of daily living.  About 2 weeks ago he was working in his yard on a very warm day carrying heavy loads of water and watering his garden.  He admits it was very intense physical activity.  He developed abrupt tightness in his chest associated with difficulty breathing.  He stopped to rest but things did not improve until he went into the house, took 2 aspirin and lay down in bed.  All told the episode lasted for about 20-30 minutes.  It has not repeated itself since then.  Roughly 18 months ago he had a brief neurological event that sounds strongly suggestive of a transient ischemic attack.  He had sudden drooping of his right face and weakness in his right hand.  He could not control the motion of his right upper extremity for about 3 minutes.  Symptoms improved spontaneously and for the most part have resolved completely although he still finds that his right hand is not as strong as it was in the past.  Has not had any focal  neurological events since then.  He denies palpitations, dizziness, syncope, claudication or palpitations.  He does have some leg pain and was referred by the orthopedist specialist to have lower extremity arterial Dopplers which showed normal ABI bilaterally.  He denies lower extremity edema.  He has occasional problems with erectile dysfunction and rarely takes sildenafil.  He smoked for about 13 years but quit in 1977.  He does not have diabetes mellitus or hypertension, although he does occasionally have situational hypertension.  He does have hypercholesterolemia (recent labs show HDL 35, LDL 169, triglycerides 267).  There is no family history of early onset coronary disease.  His older siblings passed away from issues other than cardiac illness.  On his mother side of the family there is a pattern of longevity in to mid and late 90s.  His father died around the age of 70 but he believes this was related to issues of alcoholism and benzodiazepine use.  ROS: As described above.  Studies Reviewed: Marland Kitchen   EKG Interpretation Date/Time:  Friday June 08 2023 13:08:43 EDT Ventricular Rate:  56 PR Interval:  188 QRS Duration:  90 QT Interval:  394 QTC Calculation: 380 R Axis:   29  Text Interpretation: Sinus bradycardia Low voltage QRS When compared with ECG of 03-Aug-2009 07:19, No significant change was found Confirmed by Sadiel Mota ([id number]) on  06/08/2023 2:02:05 PM   CT of the abdomen performed 2020 shows scattered moderate atherosclerotic plaque throughout the abdominal aorta, visceral branches and bilateral iliacs, without obvious obstruction.  CT today shows mild sinus bradycardia, low voltage in the limb leads.  There are no ischemic repolarization abnormalities.  Risk Assessment/Calculations:             Physical Exam:   VS:  BP 129/82   Pulse (!) 56   Ht 6\' 2"  (1.88 m)   Wt 220 lb 3.2 oz (99.9 kg)   SpO2 97%   BMI 28.27 kg/m    Wt Readings from Last 3 Encounters:  06/08/23  220 lb 3.2 oz (99.9 kg)  11/29/18 200 lb (90.7 kg)    GEN: Well nourished, well developed in no acute distress NECK: No JVD; No carotid bruits CARDIAC: RRR, no murmurs, rubs, gallops RESPIRATORY:  Clear to auscultation without rales, wheezing or rhonchi  ABDOMEN: Soft, non-tender, non-distended EXTREMITIES:  No edema; No deformity   ASSESSMENT AND PLAN: .   Chest pain: It is possible that he described an episode of unstable angina associated with acute dyspnea.  He has high risk cholesterol profile and evidence of atherosclerosis in his abdominal arterial system.  Will schedule for coronary CT angiogram.  Currently he is not symptomatic.  Resting heart rate is 56 so he probably will need beta-blockers.  Asked him to make sure he does not use sildenafil in the 24 hours preceding the CT angiogram. Fatigue/dyspnea: This precedes the episode of chest pain.  Will check an echocardiogram. HLP: Even if he does not have any atherosclerotic disease his cholesterol profile is quite abnormal and would benefit from pharmacological therapy.  Discussed changes in diet to reduce saturated fat and increase unsaturated fat from nuts/oil/fish/avocado, avoiding fried food and sugary/starchy foods.  This should help improve his HDL cholesterol bring down his triglycerides as well as help the LDL.  Regardless, I think he needs lipid-lowering therapy and will try rosuvastatin (he had muscle aches with atorvastatin in the past).  If he is intolerant to rosuvastatin as well, he may be a candidate for Repatha/Praluent but we will decide after we see his coronary CT angio and calcium score. ED: Okay to continue sildenafil, but never take nitroglycerin within 24 hours of the last dose of sildenafil.  He is aware of this precaution. TIA: The episode that occurred more than a year ago is strongly suggestive of a left-sided carotid distribution transient ischemic attack but has left almost no residual deficits.  It will probably  be worthwhile scanning his carotids but will wait for the results of the coronary CT first.  The priority will still be to address his risk factors, primarily the adverse lipid profile. Abnormal ECG: Low voltage may indicate cardiac amyloidosis, will decide whether or not this should be evaluated after we see his echocardiogram.      Dispo: Follow-up after CT and echocardiogram.  Repeat lipid profile in 2-3 months.  Signed, Thurmon Fair, MD

## 2023-06-08 NOTE — Patient Instructions (Signed)
Medication Instructions:  Rosuvastatin 10 mg every evening *If you need a refill on your cardiac medications before your next appointment, please call your pharmacy*  Testing/Procedures:   Your cardiac CT will be scheduled at one of the below locations:   [facility] [address] [place], [place] [address] [phone]   If scheduled at [facility], please arrive at the [facility] Entrance (Entrance C2) of [facility] 30 minutes prior to test start time. You can use the FREE valet parking offered at entrance C (encouraged to control the heart rate for the test)  Proceed to the [facility] Radiology Department (first floor) to check-in and test prep.  All radiology patients and guests should use entrance C2 at [facility], accessed from [facility], even though the hospital's physical address listed is [address].     Please follow these instructions carefully (unless otherwise directed):  An IV will be required for this test and Nitroglycerin will be given.  Hold all erectile dysfunction medications at least 3 days (72 hrs) prior to test. (Ie viagra, cialis, sildenafil, tadalafil, etc)   On the Night Before the Test: Be sure to Drink plenty of water. Do not consume any caffeinated/decaffeinated beverages or chocolate 12 hours prior to your test. Do not take any antihistamines 12 hours prior to your test.  On the Day of the Test: Drink plenty of water until 1 hour prior to the test. Do not eat any food 1 hour prior to test. You may take your regular medications prior to the test.  Take metoprolol (Lopressor) two hours prior to test. If you take Furosemide/Hydrochlorothiazide/Spironolactone, please HOLD on the morning of the test.  After the Test: Drink plenty of water. After receiving IV contrast, you may experience a mild flushed feeling. This is normal. On occasion, you may experience a mild rash  up to 24 hours after the test. This is not dangerous. If this occurs, you can take Benadryl 25 mg and increase your fluid intake. If you experience trouble breathing, this can be serious. If it is severe call 911 IMMEDIATELY. If it is mild, please call our office. If you take any of these medications: Glipizide/Metformin, Avandament, Glucavance, please do not take 48 hours after completing test unless otherwise instructed.  We will call to schedule your test 2-4 weeks out understanding that some insurance companies will need an authorization prior to the service being performed.   For more information and frequently asked questions, please visit our website : [email or web address]  For non-scheduling related questions, please contact the cardiac imaging nurse navigator should you have any questions/concerns: Rockwell [place], Cardiac Imaging Nurse Navigator Larey Brick, Cardiac Imaging Nurse Navigator [facility] Direct Office Dial: [phone]   For scheduling needs, including cancellations and rescheduling, please call [place], [phone].   Your physician has requested that you have an echocardiogram. Echocardiography is a painless test that uses sound waves to create images of your heart. It provides your doctor with information about the size and shape of your heart and how well your heart's chambers and valves are working. This procedure takes approximately one hour. There are no restrictions for this procedure. Please do NOT wear cologne, perfume, aftershave, or lotions (deodorant is allowed). Please arrive 15 minutes prior to your appointment time.    Follow-Up: At [facility], you and your health needs are our priority.  As part of our continuing mission to provide  you with exceptional heart care, we have created designated Provider Care Teams.  These Care Teams include your primary Cardiologist (physician) and Advanced Practice  Providers (APPs -  Physician Assistants and Nurse Practitioners) who all work together to provide you with the care you need, when you need it.  We recommend signing up for the patient portal called "MyChart".  Sign up information is provided on this After Visit Summary.  MyChart is used to connect with patients for Virtual Visits (Telemedicine).  Patients are able to view lab/test results, encounter notes, upcoming appointments, etc.  Non-urgent messages can be sent to your provider as well.   To learn more about what you can do with MyChart, go to [email or web address].    Your next appointment:    3-4 months  Provider:   Dr Royann Shivers

## 2023-06-25 ENCOUNTER — Other Ambulatory Visit: Payer: Medicare Other

## 2023-10-17 ENCOUNTER — Ambulatory Visit: Payer: Medicare Other | Attending: Cardiovascular Disease | Admitting: Cardiovascular Disease
